# Patient Record
Sex: Female | Born: 1945 | Race: Asian | Hispanic: No | Marital: Married
Health system: Southern US, Community
[De-identification: ages and names within clinical notes are randomized; demographics above are authoritative.]

---

## 2000-08-08 ENCOUNTER — Encounter: Admission: RE | Admit: 2000-08-08 | Discharge: 2000-08-08 | Payer: Self-pay | Admitting: Internal Medicine

## 2000-08-22 ENCOUNTER — Ambulatory Visit (HOSPITAL_COMMUNITY): Admission: RE | Admit: 2000-08-22 | Discharge: 2000-08-22 | Payer: Self-pay | Admitting: *Deleted

## 2001-06-05 ENCOUNTER — Ambulatory Visit (HOSPITAL_COMMUNITY): Admission: RE | Admit: 2001-06-05 | Discharge: 2001-06-05 | Payer: Self-pay | Admitting: *Deleted

## 2002-04-23 ENCOUNTER — Encounter: Payer: Self-pay | Admitting: Internal Medicine

## 2002-04-23 ENCOUNTER — Encounter: Admission: RE | Admit: 2002-04-23 | Discharge: 2002-04-23 | Payer: Self-pay | Admitting: Internal Medicine

## 2004-05-02 ENCOUNTER — Encounter: Admission: RE | Admit: 2004-05-02 | Discharge: 2004-05-02 | Payer: Self-pay | Admitting: Internal Medicine

## 2005-04-24 ENCOUNTER — Other Ambulatory Visit: Admission: RE | Admit: 2005-04-24 | Discharge: 2005-04-24 | Payer: Self-pay | Admitting: Internal Medicine

## 2005-05-09 ENCOUNTER — Encounter: Admission: RE | Admit: 2005-05-09 | Discharge: 2005-05-09 | Payer: Self-pay | Admitting: Internal Medicine

## 2010-08-20 ENCOUNTER — Other Ambulatory Visit: Admission: RE | Admit: 2010-08-20 | Discharge: 2010-08-20 | Payer: Self-pay | Admitting: Internal Medicine

## 2010-09-12 ENCOUNTER — Encounter: Admission: RE | Admit: 2010-09-12 | Discharge: 2010-09-12 | Payer: Self-pay | Admitting: Internal Medicine

## 2010-12-09 ENCOUNTER — Encounter: Payer: Self-pay | Admitting: Internal Medicine

## 2012-02-27 ENCOUNTER — Other Ambulatory Visit: Payer: Self-pay | Admitting: Internal Medicine

## 2012-02-27 DIAGNOSIS — Z1231 Encounter for screening mammogram for malignant neoplasm of breast: Secondary | ICD-10-CM

## 2012-03-23 ENCOUNTER — Ambulatory Visit
Admission: RE | Admit: 2012-03-23 | Discharge: 2012-03-23 | Disposition: A | Discharge status: Home / Self Care | Payer: Medicare Other | Source: Ambulatory Visit | Attending: Internal Medicine | Admitting: Internal Medicine

## 2012-03-23 DIAGNOSIS — Z1231 Encounter for screening mammogram for malignant neoplasm of breast: Secondary | ICD-10-CM

## 2014-03-09 ENCOUNTER — Other Ambulatory Visit: Payer: Self-pay | Admitting: Internal Medicine

## 2014-03-09 DIAGNOSIS — Z1231 Encounter for screening mammogram for malignant neoplasm of breast: Secondary | ICD-10-CM

## 2014-03-25 ENCOUNTER — Encounter (INDEPENDENT_AMBULATORY_CARE_PROVIDER_SITE_OTHER): Payer: Self-pay

## 2014-03-25 ENCOUNTER — Ambulatory Visit
Admission: RE | Admit: 2014-03-25 | Discharge: 2014-03-25 | Disposition: A | Discharge status: Home / Self Care | Payer: Commercial Managed Care - HMO | Source: Ambulatory Visit | Attending: Internal Medicine | Admitting: Internal Medicine

## 2014-03-25 DIAGNOSIS — Z1231 Encounter for screening mammogram for malignant neoplasm of breast: Secondary | ICD-10-CM

## 2014-07-12 ENCOUNTER — Other Ambulatory Visit: Payer: Self-pay | Admitting: Internal Medicine

## 2014-07-12 DIAGNOSIS — R1013 Epigastric pain: Secondary | ICD-10-CM

## 2014-07-15 ENCOUNTER — Other Ambulatory Visit: Payer: Self-pay | Admitting: Internal Medicine

## 2014-07-15 ENCOUNTER — Ambulatory Visit
Admission: RE | Admit: 2014-07-15 | Discharge: 2014-07-15 | Disposition: A | Discharge status: Home / Self Care | Payer: Commercial Managed Care - HMO | Source: Ambulatory Visit | Attending: Internal Medicine | Admitting: Internal Medicine

## 2014-07-15 DIAGNOSIS — R1013 Epigastric pain: Secondary | ICD-10-CM

## 2015-03-24 ENCOUNTER — Other Ambulatory Visit: Payer: Self-pay | Admitting: Internal Medicine

## 2015-03-24 DIAGNOSIS — Z1231 Encounter for screening mammogram for malignant neoplasm of breast: Secondary | ICD-10-CM

## 2015-04-12 ENCOUNTER — Ambulatory Visit
Admission: RE | Admit: 2015-04-12 | Discharge: 2015-04-12 | Disposition: A | Discharge status: Home / Self Care | Payer: PPO | Source: Ambulatory Visit | Attending: Internal Medicine | Admitting: Internal Medicine

## 2015-04-12 DIAGNOSIS — Z1231 Encounter for screening mammogram for malignant neoplasm of breast: Secondary | ICD-10-CM

## 2016-03-27 ENCOUNTER — Other Ambulatory Visit: Payer: Self-pay | Admitting: Internal Medicine

## 2016-03-27 DIAGNOSIS — E7800 Pure hypercholesterolemia, unspecified: Secondary | ICD-10-CM | POA: Diagnosis not present

## 2016-03-27 DIAGNOSIS — E039 Hypothyroidism, unspecified: Secondary | ICD-10-CM | POA: Diagnosis not present

## 2016-03-27 DIAGNOSIS — I10 Essential (primary) hypertension: Secondary | ICD-10-CM | POA: Diagnosis not present

## 2016-03-27 DIAGNOSIS — Z1231 Encounter for screening mammogram for malignant neoplasm of breast: Secondary | ICD-10-CM

## 2016-03-27 DIAGNOSIS — K219 Gastro-esophageal reflux disease without esophagitis: Secondary | ICD-10-CM | POA: Diagnosis not present

## 2016-03-27 DIAGNOSIS — Z0000 Encounter for general adult medical examination without abnormal findings: Secondary | ICD-10-CM | POA: Diagnosis not present

## 2016-03-27 DIAGNOSIS — M1990 Unspecified osteoarthritis, unspecified site: Secondary | ICD-10-CM | POA: Diagnosis not present

## 2016-03-27 DIAGNOSIS — Z1389 Encounter for screening for other disorder: Secondary | ICD-10-CM | POA: Diagnosis not present

## 2016-04-22 ENCOUNTER — Ambulatory Visit
Admission: RE | Admit: 2016-04-22 | Discharge: 2016-04-22 | Disposition: A | Discharge status: Home / Self Care | Payer: PPO | Source: Ambulatory Visit | Attending: Internal Medicine | Admitting: Internal Medicine

## 2016-04-22 DIAGNOSIS — Z1231 Encounter for screening mammogram for malignant neoplasm of breast: Secondary | ICD-10-CM

## 2016-05-09 DIAGNOSIS — E039 Hypothyroidism, unspecified: Secondary | ICD-10-CM | POA: Diagnosis not present

## 2016-10-09 DIAGNOSIS — I10 Essential (primary) hypertension: Secondary | ICD-10-CM | POA: Diagnosis not present

## 2016-10-09 DIAGNOSIS — K219 Gastro-esophageal reflux disease without esophagitis: Secondary | ICD-10-CM | POA: Diagnosis not present

## 2016-10-09 DIAGNOSIS — E039 Hypothyroidism, unspecified: Secondary | ICD-10-CM | POA: Diagnosis not present

## 2016-10-09 DIAGNOSIS — R21 Rash and other nonspecific skin eruption: Secondary | ICD-10-CM | POA: Diagnosis not present

## 2016-10-09 DIAGNOSIS — E7800 Pure hypercholesterolemia, unspecified: Secondary | ICD-10-CM | POA: Diagnosis not present

## 2016-10-09 DIAGNOSIS — Z23 Encounter for immunization: Secondary | ICD-10-CM | POA: Diagnosis not present

## 2016-10-09 DIAGNOSIS — L989 Disorder of the skin and subcutaneous tissue, unspecified: Secondary | ICD-10-CM | POA: Diagnosis not present

## 2017-06-18 ENCOUNTER — Other Ambulatory Visit: Payer: Self-pay | Admitting: Internal Medicine

## 2017-06-18 DIAGNOSIS — Z1231 Encounter for screening mammogram for malignant neoplasm of breast: Secondary | ICD-10-CM

## 2017-06-18 DIAGNOSIS — K219 Gastro-esophageal reflux disease without esophagitis: Secondary | ICD-10-CM | POA: Diagnosis not present

## 2017-06-18 DIAGNOSIS — Z780 Asymptomatic menopausal state: Secondary | ICD-10-CM | POA: Diagnosis not present

## 2017-06-18 DIAGNOSIS — E7800 Pure hypercholesterolemia, unspecified: Secondary | ICD-10-CM | POA: Diagnosis not present

## 2017-06-18 DIAGNOSIS — K224 Dyskinesia of esophagus: Secondary | ICD-10-CM | POA: Diagnosis not present

## 2017-06-18 DIAGNOSIS — Z1239 Encounter for other screening for malignant neoplasm of breast: Secondary | ICD-10-CM | POA: Diagnosis not present

## 2017-06-18 DIAGNOSIS — I10 Essential (primary) hypertension: Secondary | ICD-10-CM | POA: Diagnosis not present

## 2017-06-18 DIAGNOSIS — E039 Hypothyroidism, unspecified: Secondary | ICD-10-CM | POA: Diagnosis not present

## 2017-06-18 DIAGNOSIS — Z7189 Other specified counseling: Secondary | ICD-10-CM | POA: Diagnosis not present

## 2017-06-18 DIAGNOSIS — Z0000 Encounter for general adult medical examination without abnormal findings: Secondary | ICD-10-CM | POA: Diagnosis not present

## 2017-06-18 DIAGNOSIS — Z1389 Encounter for screening for other disorder: Secondary | ICD-10-CM | POA: Diagnosis not present

## 2017-07-08 ENCOUNTER — Ambulatory Visit
Admission: RE | Admit: 2017-07-08 | Discharge: 2017-07-08 | Disposition: A | Discharge status: Home / Self Care | Payer: PPO | Source: Ambulatory Visit | Attending: Internal Medicine | Admitting: Internal Medicine

## 2017-07-08 DIAGNOSIS — Z1231 Encounter for screening mammogram for malignant neoplasm of breast: Secondary | ICD-10-CM | POA: Diagnosis not present

## 2017-08-19 DIAGNOSIS — Z780 Asymptomatic menopausal state: Secondary | ICD-10-CM | POA: Diagnosis not present

## 2017-09-23 DIAGNOSIS — E039 Hypothyroidism, unspecified: Secondary | ICD-10-CM | POA: Diagnosis not present

## 2017-09-23 DIAGNOSIS — L659 Nonscarring hair loss, unspecified: Secondary | ICD-10-CM | POA: Diagnosis not present

## 2017-09-23 DIAGNOSIS — E7800 Pure hypercholesterolemia, unspecified: Secondary | ICD-10-CM | POA: Diagnosis not present

## 2017-09-23 DIAGNOSIS — I10 Essential (primary) hypertension: Secondary | ICD-10-CM | POA: Diagnosis not present

## 2017-09-23 DIAGNOSIS — K219 Gastro-esophageal reflux disease without esophagitis: Secondary | ICD-10-CM | POA: Diagnosis not present

## 2017-11-12 DIAGNOSIS — R5383 Other fatigue: Secondary | ICD-10-CM | POA: Diagnosis not present

## 2017-11-12 DIAGNOSIS — R634 Abnormal weight loss: Secondary | ICD-10-CM | POA: Diagnosis not present

## 2017-11-26 DIAGNOSIS — R700 Elevated erythrocyte sedimentation rate: Secondary | ICD-10-CM | POA: Diagnosis not present

## 2017-11-26 DIAGNOSIS — R7989 Other specified abnormal findings of blood chemistry: Secondary | ICD-10-CM | POA: Diagnosis not present

## 2017-12-02 DIAGNOSIS — L648 Other androgenic alopecia: Secondary | ICD-10-CM | POA: Diagnosis not present

## 2017-12-22 DIAGNOSIS — E039 Hypothyroidism, unspecified: Secondary | ICD-10-CM | POA: Diagnosis not present

## 2017-12-22 DIAGNOSIS — R700 Elevated erythrocyte sedimentation rate: Secondary | ICD-10-CM | POA: Diagnosis not present

## 2017-12-22 DIAGNOSIS — I10 Essential (primary) hypertension: Secondary | ICD-10-CM | POA: Diagnosis not present

## 2017-12-22 DIAGNOSIS — R7309 Other abnormal glucose: Secondary | ICD-10-CM | POA: Diagnosis not present

## 2017-12-22 DIAGNOSIS — K219 Gastro-esophageal reflux disease without esophagitis: Secondary | ICD-10-CM | POA: Diagnosis not present

## 2017-12-22 DIAGNOSIS — E7800 Pure hypercholesterolemia, unspecified: Secondary | ICD-10-CM | POA: Diagnosis not present

## 2017-12-22 DIAGNOSIS — Z1389 Encounter for screening for other disorder: Secondary | ICD-10-CM | POA: Diagnosis not present

## 2018-06-24 DIAGNOSIS — E7800 Pure hypercholesterolemia, unspecified: Secondary | ICD-10-CM | POA: Diagnosis not present

## 2018-06-24 DIAGNOSIS — E039 Hypothyroidism, unspecified: Secondary | ICD-10-CM | POA: Diagnosis not present

## 2018-06-24 DIAGNOSIS — K219 Gastro-esophageal reflux disease without esophagitis: Secondary | ICD-10-CM | POA: Diagnosis not present

## 2018-06-24 DIAGNOSIS — R7303 Prediabetes: Secondary | ICD-10-CM | POA: Diagnosis not present

## 2018-06-24 DIAGNOSIS — M1990 Unspecified osteoarthritis, unspecified site: Secondary | ICD-10-CM | POA: Diagnosis not present

## 2018-06-24 DIAGNOSIS — Z1389 Encounter for screening for other disorder: Secondary | ICD-10-CM | POA: Diagnosis not present

## 2018-06-24 DIAGNOSIS — I10 Essential (primary) hypertension: Secondary | ICD-10-CM | POA: Diagnosis not present

## 2018-06-24 DIAGNOSIS — Z0000 Encounter for general adult medical examination without abnormal findings: Secondary | ICD-10-CM | POA: Diagnosis not present

## 2018-09-15 DIAGNOSIS — M1990 Unspecified osteoarthritis, unspecified site: Secondary | ICD-10-CM | POA: Diagnosis not present

## 2018-09-15 DIAGNOSIS — E7800 Pure hypercholesterolemia, unspecified: Secondary | ICD-10-CM | POA: Diagnosis not present

## 2018-09-15 DIAGNOSIS — E039 Hypothyroidism, unspecified: Secondary | ICD-10-CM | POA: Diagnosis not present

## 2018-09-15 DIAGNOSIS — I10 Essential (primary) hypertension: Secondary | ICD-10-CM | POA: Diagnosis not present

## 2018-12-29 DIAGNOSIS — E7800 Pure hypercholesterolemia, unspecified: Secondary | ICD-10-CM | POA: Diagnosis not present

## 2018-12-29 DIAGNOSIS — K219 Gastro-esophageal reflux disease without esophagitis: Secondary | ICD-10-CM | POA: Diagnosis not present

## 2018-12-29 DIAGNOSIS — I10 Essential (primary) hypertension: Secondary | ICD-10-CM | POA: Diagnosis not present

## 2018-12-29 DIAGNOSIS — E039 Hypothyroidism, unspecified: Secondary | ICD-10-CM | POA: Diagnosis not present

## 2018-12-29 DIAGNOSIS — L299 Pruritus, unspecified: Secondary | ICD-10-CM | POA: Diagnosis not present

## 2019-01-04 DIAGNOSIS — R208 Other disturbances of skin sensation: Secondary | ICD-10-CM | POA: Diagnosis not present

## 2019-01-04 DIAGNOSIS — L218 Other seborrheic dermatitis: Secondary | ICD-10-CM | POA: Diagnosis not present

## 2019-01-04 DIAGNOSIS — B86 Scabies: Secondary | ICD-10-CM | POA: Diagnosis not present

## 2019-05-17 DIAGNOSIS — M1990 Unspecified osteoarthritis, unspecified site: Secondary | ICD-10-CM | POA: Diagnosis not present

## 2019-05-17 DIAGNOSIS — E039 Hypothyroidism, unspecified: Secondary | ICD-10-CM | POA: Diagnosis not present

## 2019-05-17 DIAGNOSIS — E7800 Pure hypercholesterolemia, unspecified: Secondary | ICD-10-CM | POA: Diagnosis not present

## 2019-05-17 DIAGNOSIS — I10 Essential (primary) hypertension: Secondary | ICD-10-CM | POA: Diagnosis not present

## 2019-05-27 DIAGNOSIS — E039 Hypothyroidism, unspecified: Secondary | ICD-10-CM | POA: Diagnosis not present

## 2019-05-27 DIAGNOSIS — E7800 Pure hypercholesterolemia, unspecified: Secondary | ICD-10-CM | POA: Diagnosis not present

## 2019-05-27 DIAGNOSIS — M1990 Unspecified osteoarthritis, unspecified site: Secondary | ICD-10-CM | POA: Diagnosis not present

## 2019-05-27 DIAGNOSIS — I10 Essential (primary) hypertension: Secondary | ICD-10-CM | POA: Diagnosis not present

## 2019-07-06 ENCOUNTER — Other Ambulatory Visit: Payer: Self-pay | Admitting: Internal Medicine

## 2019-07-06 DIAGNOSIS — R7309 Other abnormal glucose: Secondary | ICD-10-CM | POA: Diagnosis not present

## 2019-07-06 DIAGNOSIS — Z1231 Encounter for screening mammogram for malignant neoplasm of breast: Secondary | ICD-10-CM

## 2019-07-06 DIAGNOSIS — Z1239 Encounter for other screening for malignant neoplasm of breast: Secondary | ICD-10-CM | POA: Diagnosis not present

## 2019-07-06 DIAGNOSIS — I10 Essential (primary) hypertension: Secondary | ICD-10-CM | POA: Diagnosis not present

## 2019-07-06 DIAGNOSIS — K219 Gastro-esophageal reflux disease without esophagitis: Secondary | ICD-10-CM | POA: Diagnosis not present

## 2019-07-06 DIAGNOSIS — J309 Allergic rhinitis, unspecified: Secondary | ICD-10-CM | POA: Diagnosis not present

## 2019-07-06 DIAGNOSIS — E039 Hypothyroidism, unspecified: Secondary | ICD-10-CM | POA: Diagnosis not present

## 2019-07-06 DIAGNOSIS — Z1389 Encounter for screening for other disorder: Secondary | ICD-10-CM | POA: Diagnosis not present

## 2019-07-06 DIAGNOSIS — Z0000 Encounter for general adult medical examination without abnormal findings: Secondary | ICD-10-CM | POA: Diagnosis not present

## 2019-07-06 DIAGNOSIS — R7303 Prediabetes: Secondary | ICD-10-CM | POA: Diagnosis not present

## 2019-07-06 DIAGNOSIS — H8110 Benign paroxysmal vertigo, unspecified ear: Secondary | ICD-10-CM | POA: Diagnosis not present

## 2019-07-06 DIAGNOSIS — E7800 Pure hypercholesterolemia, unspecified: Secondary | ICD-10-CM | POA: Diagnosis not present

## 2019-07-19 DIAGNOSIS — E039 Hypothyroidism, unspecified: Secondary | ICD-10-CM | POA: Diagnosis not present

## 2019-07-19 DIAGNOSIS — M1990 Unspecified osteoarthritis, unspecified site: Secondary | ICD-10-CM | POA: Diagnosis not present

## 2019-07-19 DIAGNOSIS — E7800 Pure hypercholesterolemia, unspecified: Secondary | ICD-10-CM | POA: Diagnosis not present

## 2019-07-19 DIAGNOSIS — I10 Essential (primary) hypertension: Secondary | ICD-10-CM | POA: Diagnosis not present

## 2019-08-06 DIAGNOSIS — M1990 Unspecified osteoarthritis, unspecified site: Secondary | ICD-10-CM | POA: Diagnosis not present

## 2019-08-06 DIAGNOSIS — I10 Essential (primary) hypertension: Secondary | ICD-10-CM | POA: Diagnosis not present

## 2019-08-06 DIAGNOSIS — E039 Hypothyroidism, unspecified: Secondary | ICD-10-CM | POA: Diagnosis not present

## 2019-08-06 DIAGNOSIS — E7800 Pure hypercholesterolemia, unspecified: Secondary | ICD-10-CM | POA: Diagnosis not present

## 2019-08-20 ENCOUNTER — Other Ambulatory Visit: Payer: Self-pay

## 2019-08-20 ENCOUNTER — Ambulatory Visit
Admission: RE | Admit: 2019-08-20 | Discharge: 2019-08-20 | Disposition: A | Discharge status: Home / Self Care | Payer: PPO | Source: Ambulatory Visit | Attending: Internal Medicine | Admitting: Internal Medicine

## 2019-08-20 DIAGNOSIS — Z1231 Encounter for screening mammogram for malignant neoplasm of breast: Secondary | ICD-10-CM

## 2019-08-26 DIAGNOSIS — M1990 Unspecified osteoarthritis, unspecified site: Secondary | ICD-10-CM | POA: Diagnosis not present

## 2019-08-26 DIAGNOSIS — E039 Hypothyroidism, unspecified: Secondary | ICD-10-CM | POA: Diagnosis not present

## 2019-08-26 DIAGNOSIS — I10 Essential (primary) hypertension: Secondary | ICD-10-CM | POA: Diagnosis not present

## 2019-08-26 DIAGNOSIS — E7800 Pure hypercholesterolemia, unspecified: Secondary | ICD-10-CM | POA: Diagnosis not present

## 2019-10-04 DIAGNOSIS — E7800 Pure hypercholesterolemia, unspecified: Secondary | ICD-10-CM | POA: Diagnosis not present

## 2019-10-04 DIAGNOSIS — E039 Hypothyroidism, unspecified: Secondary | ICD-10-CM | POA: Diagnosis not present

## 2019-10-04 DIAGNOSIS — M1990 Unspecified osteoarthritis, unspecified site: Secondary | ICD-10-CM | POA: Diagnosis not present

## 2019-10-04 DIAGNOSIS — I10 Essential (primary) hypertension: Secondary | ICD-10-CM | POA: Diagnosis not present

## 2019-10-20 DIAGNOSIS — M1990 Unspecified osteoarthritis, unspecified site: Secondary | ICD-10-CM | POA: Diagnosis not present

## 2019-10-20 DIAGNOSIS — I10 Essential (primary) hypertension: Secondary | ICD-10-CM | POA: Diagnosis not present

## 2019-10-20 DIAGNOSIS — E039 Hypothyroidism, unspecified: Secondary | ICD-10-CM | POA: Diagnosis not present

## 2019-10-20 DIAGNOSIS — E7800 Pure hypercholesterolemia, unspecified: Secondary | ICD-10-CM | POA: Diagnosis not present

## 2019-11-24 DIAGNOSIS — I10 Essential (primary) hypertension: Secondary | ICD-10-CM | POA: Diagnosis not present

## 2019-11-24 DIAGNOSIS — E039 Hypothyroidism, unspecified: Secondary | ICD-10-CM | POA: Diagnosis not present

## 2019-11-24 DIAGNOSIS — M1990 Unspecified osteoarthritis, unspecified site: Secondary | ICD-10-CM | POA: Diagnosis not present

## 2019-11-24 DIAGNOSIS — E7800 Pure hypercholesterolemia, unspecified: Secondary | ICD-10-CM | POA: Diagnosis not present

## 2019-12-24 DIAGNOSIS — E1169 Type 2 diabetes mellitus with other specified complication: Secondary | ICD-10-CM | POA: Diagnosis not present

## 2019-12-24 DIAGNOSIS — M1990 Unspecified osteoarthritis, unspecified site: Secondary | ICD-10-CM | POA: Diagnosis not present

## 2019-12-24 DIAGNOSIS — I10 Essential (primary) hypertension: Secondary | ICD-10-CM | POA: Diagnosis not present

## 2019-12-24 DIAGNOSIS — E039 Hypothyroidism, unspecified: Secondary | ICD-10-CM | POA: Diagnosis not present

## 2019-12-24 DIAGNOSIS — E7800 Pure hypercholesterolemia, unspecified: Secondary | ICD-10-CM | POA: Diagnosis not present

## 2020-01-12 DIAGNOSIS — E1169 Type 2 diabetes mellitus with other specified complication: Secondary | ICD-10-CM | POA: Diagnosis not present

## 2020-01-12 DIAGNOSIS — I10 Essential (primary) hypertension: Secondary | ICD-10-CM | POA: Diagnosis not present

## 2020-01-12 DIAGNOSIS — E7800 Pure hypercholesterolemia, unspecified: Secondary | ICD-10-CM | POA: Diagnosis not present

## 2020-01-12 DIAGNOSIS — E039 Hypothyroidism, unspecified: Secondary | ICD-10-CM | POA: Diagnosis not present

## 2020-01-12 DIAGNOSIS — E119 Type 2 diabetes mellitus without complications: Secondary | ICD-10-CM | POA: Diagnosis not present

## 2020-01-12 DIAGNOSIS — M1990 Unspecified osteoarthritis, unspecified site: Secondary | ICD-10-CM | POA: Diagnosis not present

## 2020-01-21 DIAGNOSIS — E039 Hypothyroidism, unspecified: Secondary | ICD-10-CM | POA: Diagnosis not present

## 2020-01-21 DIAGNOSIS — I10 Essential (primary) hypertension: Secondary | ICD-10-CM | POA: Diagnosis not present

## 2020-01-21 DIAGNOSIS — Z7984 Long term (current) use of oral hypoglycemic drugs: Secondary | ICD-10-CM | POA: Diagnosis not present

## 2020-01-21 DIAGNOSIS — E1169 Type 2 diabetes mellitus with other specified complication: Secondary | ICD-10-CM | POA: Diagnosis not present

## 2020-01-21 DIAGNOSIS — E7800 Pure hypercholesterolemia, unspecified: Secondary | ICD-10-CM | POA: Diagnosis not present

## 2020-01-21 DIAGNOSIS — M1990 Unspecified osteoarthritis, unspecified site: Secondary | ICD-10-CM | POA: Diagnosis not present

## 2020-02-22 DIAGNOSIS — E7800 Pure hypercholesterolemia, unspecified: Secondary | ICD-10-CM | POA: Diagnosis not present

## 2020-02-22 DIAGNOSIS — M1990 Unspecified osteoarthritis, unspecified site: Secondary | ICD-10-CM | POA: Diagnosis not present

## 2020-02-22 DIAGNOSIS — E1169 Type 2 diabetes mellitus with other specified complication: Secondary | ICD-10-CM | POA: Diagnosis not present

## 2020-02-22 DIAGNOSIS — I10 Essential (primary) hypertension: Secondary | ICD-10-CM | POA: Diagnosis not present

## 2020-02-22 DIAGNOSIS — E039 Hypothyroidism, unspecified: Secondary | ICD-10-CM | POA: Diagnosis not present

## 2020-03-20 DIAGNOSIS — E7800 Pure hypercholesterolemia, unspecified: Secondary | ICD-10-CM | POA: Diagnosis not present

## 2020-03-20 DIAGNOSIS — I10 Essential (primary) hypertension: Secondary | ICD-10-CM | POA: Diagnosis not present

## 2020-03-20 DIAGNOSIS — M1990 Unspecified osteoarthritis, unspecified site: Secondary | ICD-10-CM | POA: Diagnosis not present

## 2020-03-20 DIAGNOSIS — E039 Hypothyroidism, unspecified: Secondary | ICD-10-CM | POA: Diagnosis not present

## 2020-03-20 DIAGNOSIS — E1169 Type 2 diabetes mellitus with other specified complication: Secondary | ICD-10-CM | POA: Diagnosis not present

## 2020-04-24 DIAGNOSIS — E1169 Type 2 diabetes mellitus with other specified complication: Secondary | ICD-10-CM | POA: Diagnosis not present

## 2020-04-24 DIAGNOSIS — M1990 Unspecified osteoarthritis, unspecified site: Secondary | ICD-10-CM | POA: Diagnosis not present

## 2020-04-24 DIAGNOSIS — E7800 Pure hypercholesterolemia, unspecified: Secondary | ICD-10-CM | POA: Diagnosis not present

## 2020-04-24 DIAGNOSIS — E039 Hypothyroidism, unspecified: Secondary | ICD-10-CM | POA: Diagnosis not present

## 2020-04-24 DIAGNOSIS — I10 Essential (primary) hypertension: Secondary | ICD-10-CM | POA: Diagnosis not present

## 2020-06-01 DIAGNOSIS — I10 Essential (primary) hypertension: Secondary | ICD-10-CM | POA: Diagnosis not present

## 2020-06-01 DIAGNOSIS — E7800 Pure hypercholesterolemia, unspecified: Secondary | ICD-10-CM | POA: Diagnosis not present

## 2020-06-01 DIAGNOSIS — M1990 Unspecified osteoarthritis, unspecified site: Secondary | ICD-10-CM | POA: Diagnosis not present

## 2020-06-01 DIAGNOSIS — E1169 Type 2 diabetes mellitus with other specified complication: Secondary | ICD-10-CM | POA: Diagnosis not present

## 2020-06-01 DIAGNOSIS — E039 Hypothyroidism, unspecified: Secondary | ICD-10-CM | POA: Diagnosis not present

## 2020-06-21 DIAGNOSIS — E039 Hypothyroidism, unspecified: Secondary | ICD-10-CM | POA: Diagnosis not present

## 2020-06-21 DIAGNOSIS — I10 Essential (primary) hypertension: Secondary | ICD-10-CM | POA: Diagnosis not present

## 2020-06-21 DIAGNOSIS — E7800 Pure hypercholesterolemia, unspecified: Secondary | ICD-10-CM | POA: Diagnosis not present

## 2020-06-21 DIAGNOSIS — E1169 Type 2 diabetes mellitus with other specified complication: Secondary | ICD-10-CM | POA: Diagnosis not present

## 2020-06-21 DIAGNOSIS — Z7984 Long term (current) use of oral hypoglycemic drugs: Secondary | ICD-10-CM | POA: Diagnosis not present

## 2020-06-21 DIAGNOSIS — M1990 Unspecified osteoarthritis, unspecified site: Secondary | ICD-10-CM | POA: Diagnosis not present

## 2020-07-11 DIAGNOSIS — Z1389 Encounter for screening for other disorder: Secondary | ICD-10-CM | POA: Diagnosis not present

## 2020-07-11 DIAGNOSIS — E1169 Type 2 diabetes mellitus with other specified complication: Secondary | ICD-10-CM | POA: Diagnosis not present

## 2020-07-11 DIAGNOSIS — E039 Hypothyroidism, unspecified: Secondary | ICD-10-CM | POA: Diagnosis not present

## 2020-07-11 DIAGNOSIS — M1990 Unspecified osteoarthritis, unspecified site: Secondary | ICD-10-CM | POA: Diagnosis not present

## 2020-07-11 DIAGNOSIS — Z0000 Encounter for general adult medical examination without abnormal findings: Secondary | ICD-10-CM | POA: Diagnosis not present

## 2020-07-11 DIAGNOSIS — K219 Gastro-esophageal reflux disease without esophagitis: Secondary | ICD-10-CM | POA: Diagnosis not present

## 2020-07-11 DIAGNOSIS — I10 Essential (primary) hypertension: Secondary | ICD-10-CM | POA: Diagnosis not present

## 2020-07-11 DIAGNOSIS — E7800 Pure hypercholesterolemia, unspecified: Secondary | ICD-10-CM | POA: Diagnosis not present

## 2020-07-21 DIAGNOSIS — E1169 Type 2 diabetes mellitus with other specified complication: Secondary | ICD-10-CM | POA: Diagnosis not present

## 2020-07-21 DIAGNOSIS — I10 Essential (primary) hypertension: Secondary | ICD-10-CM | POA: Diagnosis not present

## 2020-07-21 DIAGNOSIS — E7800 Pure hypercholesterolemia, unspecified: Secondary | ICD-10-CM | POA: Diagnosis not present

## 2020-07-21 DIAGNOSIS — E039 Hypothyroidism, unspecified: Secondary | ICD-10-CM | POA: Diagnosis not present

## 2020-07-21 DIAGNOSIS — M1990 Unspecified osteoarthritis, unspecified site: Secondary | ICD-10-CM | POA: Diagnosis not present

## 2020-09-06 DIAGNOSIS — I10 Essential (primary) hypertension: Secondary | ICD-10-CM | POA: Diagnosis not present

## 2020-09-06 DIAGNOSIS — E1169 Type 2 diabetes mellitus with other specified complication: Secondary | ICD-10-CM | POA: Diagnosis not present

## 2020-09-06 DIAGNOSIS — E7800 Pure hypercholesterolemia, unspecified: Secondary | ICD-10-CM | POA: Diagnosis not present

## 2020-09-06 DIAGNOSIS — E039 Hypothyroidism, unspecified: Secondary | ICD-10-CM | POA: Diagnosis not present

## 2020-09-06 DIAGNOSIS — M1990 Unspecified osteoarthritis, unspecified site: Secondary | ICD-10-CM | POA: Diagnosis not present

## 2020-09-18 DIAGNOSIS — E7800 Pure hypercholesterolemia, unspecified: Secondary | ICD-10-CM | POA: Diagnosis not present

## 2020-09-18 DIAGNOSIS — E039 Hypothyroidism, unspecified: Secondary | ICD-10-CM | POA: Diagnosis not present

## 2020-09-18 DIAGNOSIS — E1169 Type 2 diabetes mellitus with other specified complication: Secondary | ICD-10-CM | POA: Diagnosis not present

## 2020-09-18 DIAGNOSIS — K219 Gastro-esophageal reflux disease without esophagitis: Secondary | ICD-10-CM | POA: Diagnosis not present

## 2020-09-18 DIAGNOSIS — I10 Essential (primary) hypertension: Secondary | ICD-10-CM | POA: Diagnosis not present

## 2020-09-18 DIAGNOSIS — M1990 Unspecified osteoarthritis, unspecified site: Secondary | ICD-10-CM | POA: Diagnosis not present

## 2020-11-02 DIAGNOSIS — M1990 Unspecified osteoarthritis, unspecified site: Secondary | ICD-10-CM | POA: Diagnosis not present

## 2020-11-02 DIAGNOSIS — E7800 Pure hypercholesterolemia, unspecified: Secondary | ICD-10-CM | POA: Diagnosis not present

## 2020-11-02 DIAGNOSIS — E039 Hypothyroidism, unspecified: Secondary | ICD-10-CM | POA: Diagnosis not present

## 2020-11-02 DIAGNOSIS — K219 Gastro-esophageal reflux disease without esophagitis: Secondary | ICD-10-CM | POA: Diagnosis not present

## 2020-11-02 DIAGNOSIS — I10 Essential (primary) hypertension: Secondary | ICD-10-CM | POA: Diagnosis not present

## 2020-11-02 DIAGNOSIS — E1169 Type 2 diabetes mellitus with other specified complication: Secondary | ICD-10-CM | POA: Diagnosis not present

## 2020-11-30 DIAGNOSIS — K219 Gastro-esophageal reflux disease without esophagitis: Secondary | ICD-10-CM | POA: Diagnosis not present

## 2020-11-30 DIAGNOSIS — M1990 Unspecified osteoarthritis, unspecified site: Secondary | ICD-10-CM | POA: Diagnosis not present

## 2020-11-30 DIAGNOSIS — E039 Hypothyroidism, unspecified: Secondary | ICD-10-CM | POA: Diagnosis not present

## 2020-11-30 DIAGNOSIS — E7800 Pure hypercholesterolemia, unspecified: Secondary | ICD-10-CM | POA: Diagnosis not present

## 2020-11-30 DIAGNOSIS — E1169 Type 2 diabetes mellitus with other specified complication: Secondary | ICD-10-CM | POA: Diagnosis not present

## 2020-11-30 DIAGNOSIS — I10 Essential (primary) hypertension: Secondary | ICD-10-CM | POA: Diagnosis not present

## 2021-01-09 DIAGNOSIS — J309 Allergic rhinitis, unspecified: Secondary | ICD-10-CM | POA: Diagnosis not present

## 2021-01-09 DIAGNOSIS — E119 Type 2 diabetes mellitus without complications: Secondary | ICD-10-CM | POA: Diagnosis not present

## 2021-01-09 DIAGNOSIS — E1169 Type 2 diabetes mellitus with other specified complication: Secondary | ICD-10-CM | POA: Diagnosis not present

## 2021-01-09 DIAGNOSIS — K219 Gastro-esophageal reflux disease without esophagitis: Secondary | ICD-10-CM | POA: Diagnosis not present

## 2021-01-09 DIAGNOSIS — I10 Essential (primary) hypertension: Secondary | ICD-10-CM | POA: Diagnosis not present

## 2021-01-09 DIAGNOSIS — E039 Hypothyroidism, unspecified: Secondary | ICD-10-CM | POA: Diagnosis not present

## 2021-01-09 DIAGNOSIS — E7800 Pure hypercholesterolemia, unspecified: Secondary | ICD-10-CM | POA: Diagnosis not present

## 2021-01-09 DIAGNOSIS — Z7984 Long term (current) use of oral hypoglycemic drugs: Secondary | ICD-10-CM | POA: Diagnosis not present

## 2021-01-11 DIAGNOSIS — E1169 Type 2 diabetes mellitus with other specified complication: Secondary | ICD-10-CM | POA: Diagnosis not present

## 2021-01-11 DIAGNOSIS — M1990 Unspecified osteoarthritis, unspecified site: Secondary | ICD-10-CM | POA: Diagnosis not present

## 2021-01-11 DIAGNOSIS — K219 Gastro-esophageal reflux disease without esophagitis: Secondary | ICD-10-CM | POA: Diagnosis not present

## 2021-01-11 DIAGNOSIS — E7800 Pure hypercholesterolemia, unspecified: Secondary | ICD-10-CM | POA: Diagnosis not present

## 2021-01-11 DIAGNOSIS — I10 Essential (primary) hypertension: Secondary | ICD-10-CM | POA: Diagnosis not present

## 2021-01-11 DIAGNOSIS — E039 Hypothyroidism, unspecified: Secondary | ICD-10-CM | POA: Diagnosis not present

## 2021-01-11 IMAGING — MG MM DIGITAL SCREENING BILAT W/ TOMO W/ CAD
8 series · 8 of 24 positions shown · non-contrast
Comparison: Previous exam(s).

CLINICAL DATA: Screening.

EXAM:
DIGITAL SCREENING BILATERAL MAMMOGRAM WITH TOMO AND CAD

[R CC synth-2D]
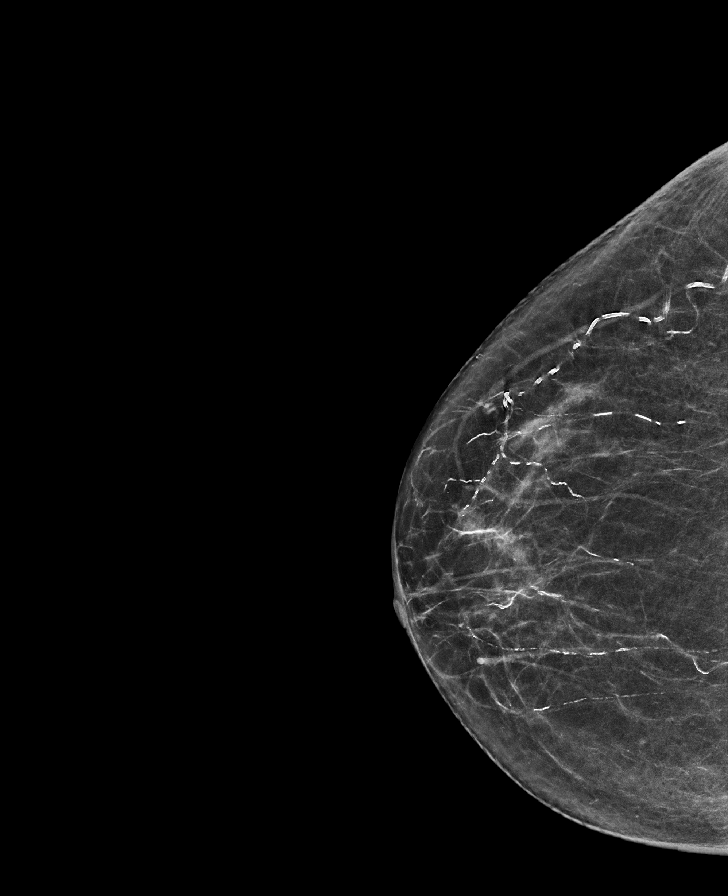

[L MLO synth-2D]
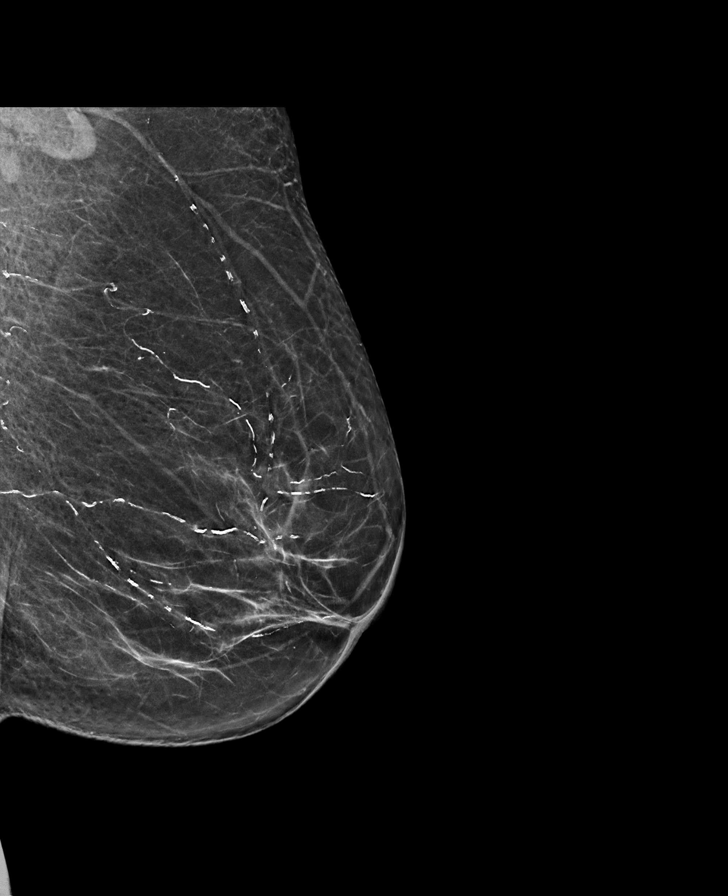

[R MLO synth-2D]
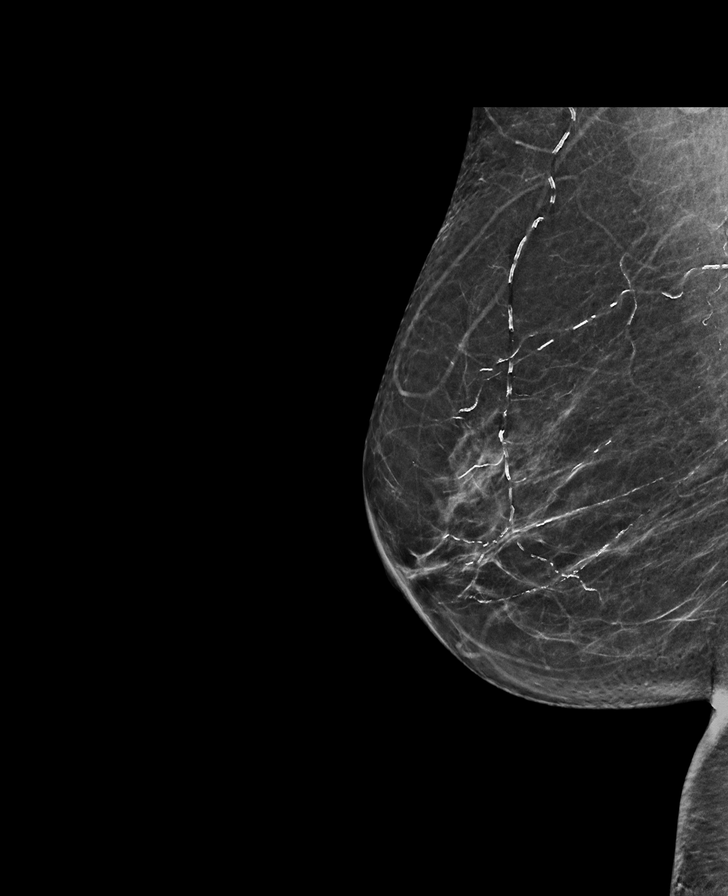

[L CC synth-2D]
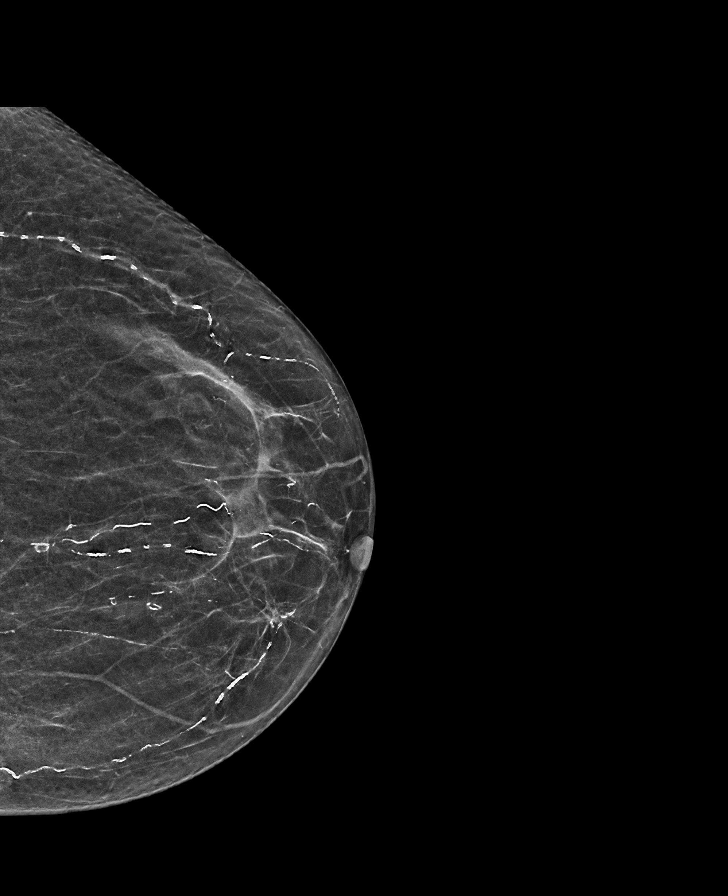

[R CC tomo · tomo slice 32/63.0]
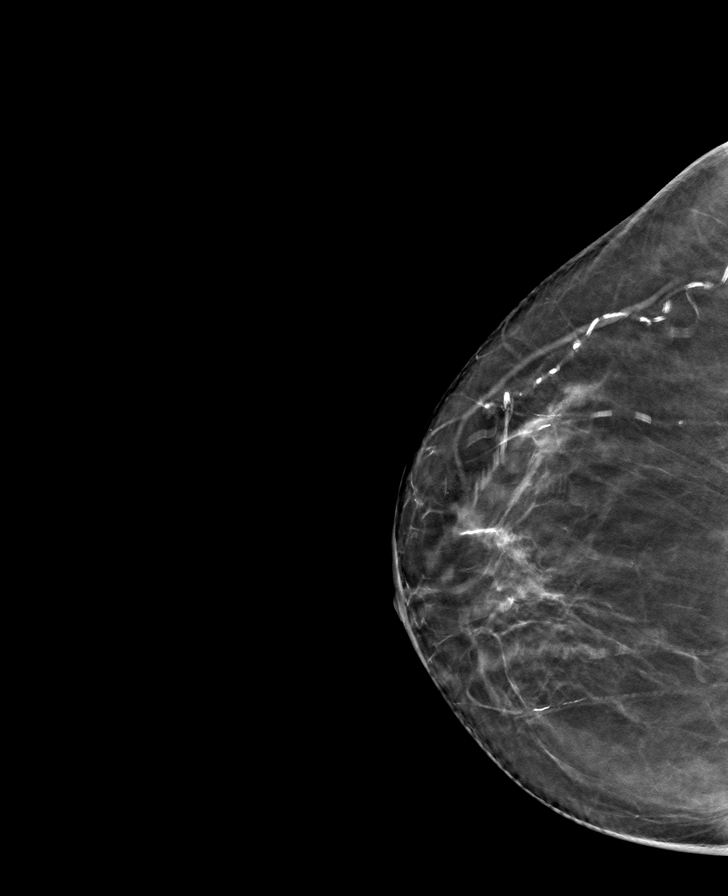

[R MLO tomo · tomo slice 31/61.0]
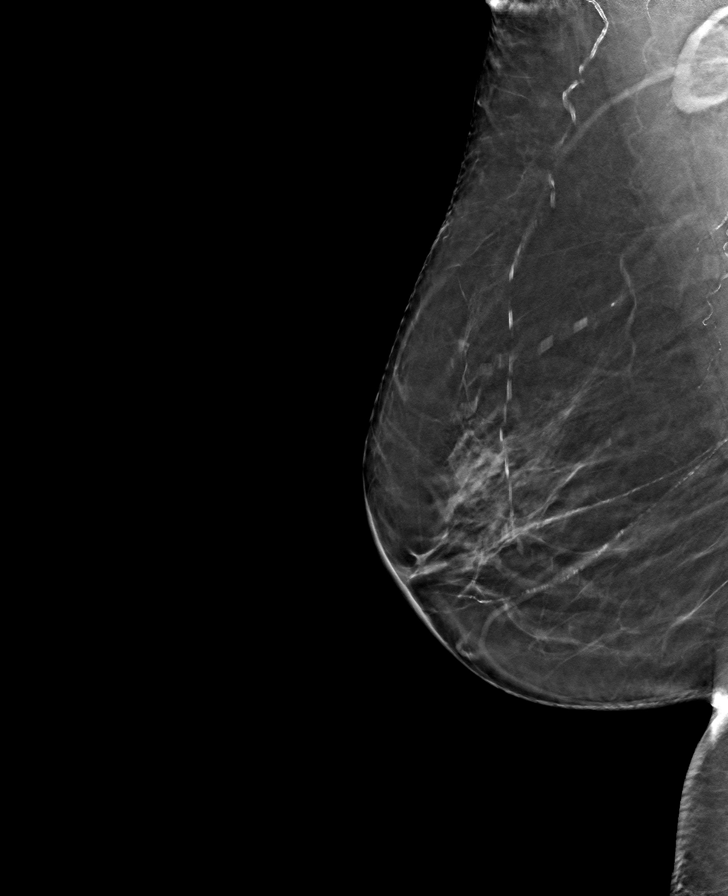

[L CC tomo · tomo slice 27/54.0]
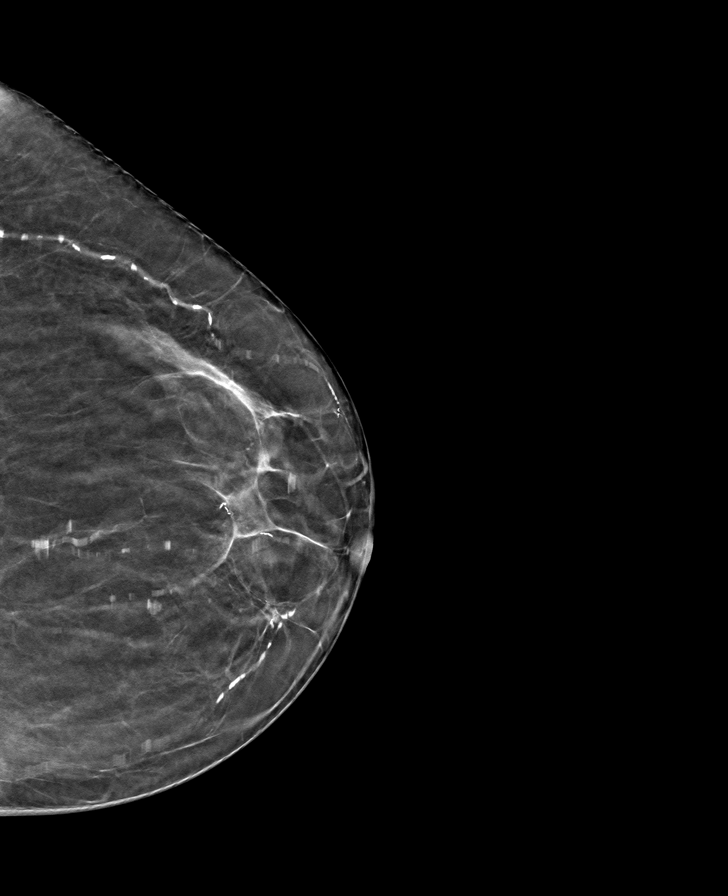

[L MLO tomo · tomo slice 31/62.0]
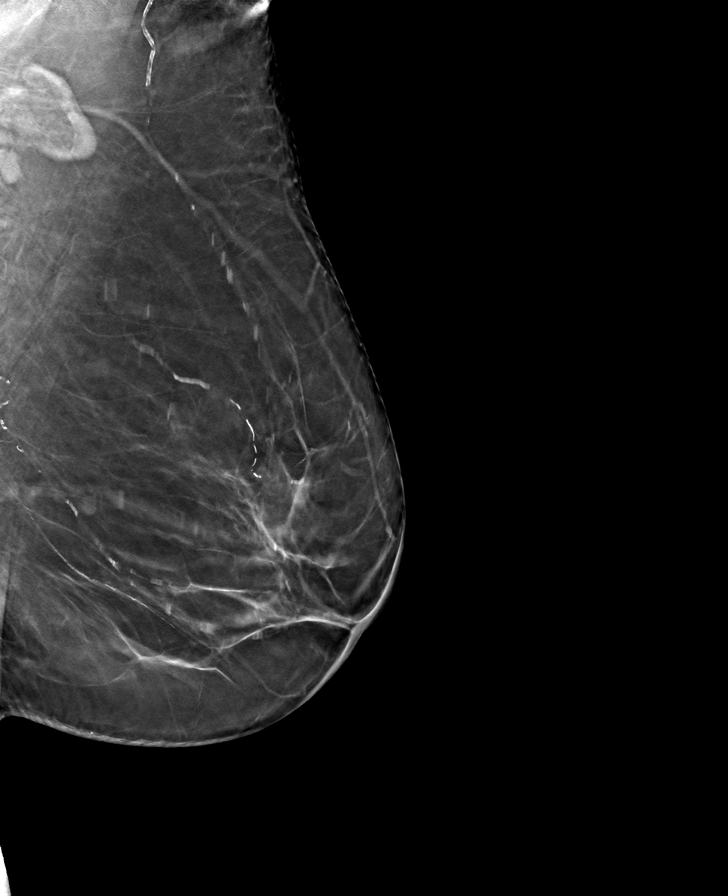

[8 of 24 positions shown; findings below may reference images not displayed]

ACR Breast Density Category b: There are scattered areas of
fibroglandular density.
FINDINGS: There are no findings suspicious for malignancy. Images were
processed with CAD.
IMPRESSION: No mammographic evidence of malignancy. A result letter of this
screening mammogram will be mailed directly to the patient.

RECOMMENDATION:
Screening mammogram in one year. (Code:[id number])

BI-RADS CATEGORY  1: Negative.

## 2021-01-19 DIAGNOSIS — K219 Gastro-esophageal reflux disease without esophagitis: Secondary | ICD-10-CM | POA: Diagnosis not present

## 2021-01-19 DIAGNOSIS — E039 Hypothyroidism, unspecified: Secondary | ICD-10-CM | POA: Diagnosis not present

## 2021-01-19 DIAGNOSIS — E1169 Type 2 diabetes mellitus with other specified complication: Secondary | ICD-10-CM | POA: Diagnosis not present

## 2021-01-19 DIAGNOSIS — E7800 Pure hypercholesterolemia, unspecified: Secondary | ICD-10-CM | POA: Diagnosis not present

## 2021-01-19 DIAGNOSIS — M1990 Unspecified osteoarthritis, unspecified site: Secondary | ICD-10-CM | POA: Diagnosis not present

## 2021-01-19 DIAGNOSIS — I10 Essential (primary) hypertension: Secondary | ICD-10-CM | POA: Diagnosis not present

## 2021-02-19 DIAGNOSIS — I10 Essential (primary) hypertension: Secondary | ICD-10-CM | POA: Diagnosis not present

## 2021-02-19 DIAGNOSIS — M1990 Unspecified osteoarthritis, unspecified site: Secondary | ICD-10-CM | POA: Diagnosis not present

## 2021-02-19 DIAGNOSIS — E039 Hypothyroidism, unspecified: Secondary | ICD-10-CM | POA: Diagnosis not present

## 2021-02-19 DIAGNOSIS — E1169 Type 2 diabetes mellitus with other specified complication: Secondary | ICD-10-CM | POA: Diagnosis not present

## 2021-02-19 DIAGNOSIS — K219 Gastro-esophageal reflux disease without esophagitis: Secondary | ICD-10-CM | POA: Diagnosis not present

## 2021-02-19 DIAGNOSIS — E7800 Pure hypercholesterolemia, unspecified: Secondary | ICD-10-CM | POA: Diagnosis not present

## 2021-03-20 DIAGNOSIS — I10 Essential (primary) hypertension: Secondary | ICD-10-CM | POA: Diagnosis not present

## 2021-03-20 DIAGNOSIS — K219 Gastro-esophageal reflux disease without esophagitis: Secondary | ICD-10-CM | POA: Diagnosis not present

## 2021-03-20 DIAGNOSIS — E1169 Type 2 diabetes mellitus with other specified complication: Secondary | ICD-10-CM | POA: Diagnosis not present

## 2021-03-20 DIAGNOSIS — E039 Hypothyroidism, unspecified: Secondary | ICD-10-CM | POA: Diagnosis not present

## 2021-03-20 DIAGNOSIS — E7800 Pure hypercholesterolemia, unspecified: Secondary | ICD-10-CM | POA: Diagnosis not present

## 2021-03-20 DIAGNOSIS — M1990 Unspecified osteoarthritis, unspecified site: Secondary | ICD-10-CM | POA: Diagnosis not present

## 2021-05-17 DIAGNOSIS — E1169 Type 2 diabetes mellitus with other specified complication: Secondary | ICD-10-CM | POA: Diagnosis not present

## 2021-05-17 DIAGNOSIS — M1990 Unspecified osteoarthritis, unspecified site: Secondary | ICD-10-CM | POA: Diagnosis not present

## 2021-05-17 DIAGNOSIS — K219 Gastro-esophageal reflux disease without esophagitis: Secondary | ICD-10-CM | POA: Diagnosis not present

## 2021-05-17 DIAGNOSIS — E7800 Pure hypercholesterolemia, unspecified: Secondary | ICD-10-CM | POA: Diagnosis not present

## 2021-05-17 DIAGNOSIS — E039 Hypothyroidism, unspecified: Secondary | ICD-10-CM | POA: Diagnosis not present

## 2021-05-17 DIAGNOSIS — I10 Essential (primary) hypertension: Secondary | ICD-10-CM | POA: Diagnosis not present

## 2021-06-15 DIAGNOSIS — M1990 Unspecified osteoarthritis, unspecified site: Secondary | ICD-10-CM | POA: Diagnosis not present

## 2021-06-15 DIAGNOSIS — E1169 Type 2 diabetes mellitus with other specified complication: Secondary | ICD-10-CM | POA: Diagnosis not present

## 2021-06-15 DIAGNOSIS — E039 Hypothyroidism, unspecified: Secondary | ICD-10-CM | POA: Diagnosis not present

## 2021-06-15 DIAGNOSIS — K219 Gastro-esophageal reflux disease without esophagitis: Secondary | ICD-10-CM | POA: Diagnosis not present

## 2021-06-15 DIAGNOSIS — I10 Essential (primary) hypertension: Secondary | ICD-10-CM | POA: Diagnosis not present

## 2021-06-15 DIAGNOSIS — E7800 Pure hypercholesterolemia, unspecified: Secondary | ICD-10-CM | POA: Diagnosis not present

## 2021-07-17 DIAGNOSIS — J309 Allergic rhinitis, unspecified: Secondary | ICD-10-CM | POA: Diagnosis not present

## 2021-07-17 DIAGNOSIS — Z1211 Encounter for screening for malignant neoplasm of colon: Secondary | ICD-10-CM | POA: Diagnosis not present

## 2021-07-17 DIAGNOSIS — E1169 Type 2 diabetes mellitus with other specified complication: Secondary | ICD-10-CM | POA: Diagnosis not present

## 2021-07-17 DIAGNOSIS — I10 Essential (primary) hypertension: Secondary | ICD-10-CM | POA: Diagnosis not present

## 2021-07-17 DIAGNOSIS — E7800 Pure hypercholesterolemia, unspecified: Secondary | ICD-10-CM | POA: Diagnosis not present

## 2021-07-17 DIAGNOSIS — K219 Gastro-esophageal reflux disease without esophagitis: Secondary | ICD-10-CM | POA: Diagnosis not present

## 2021-07-17 DIAGNOSIS — Z0000 Encounter for general adult medical examination without abnormal findings: Secondary | ICD-10-CM | POA: Diagnosis not present

## 2021-07-17 DIAGNOSIS — M5430 Sciatica, unspecified side: Secondary | ICD-10-CM | POA: Diagnosis not present

## 2021-07-17 DIAGNOSIS — E039 Hypothyroidism, unspecified: Secondary | ICD-10-CM | POA: Diagnosis not present

## 2021-07-17 DIAGNOSIS — Z23 Encounter for immunization: Secondary | ICD-10-CM | POA: Diagnosis not present

## 2021-07-17 DIAGNOSIS — Z1159 Encounter for screening for other viral diseases: Secondary | ICD-10-CM | POA: Diagnosis not present

## 2021-07-17 DIAGNOSIS — Z1389 Encounter for screening for other disorder: Secondary | ICD-10-CM | POA: Diagnosis not present

## 2021-07-17 DIAGNOSIS — M1990 Unspecified osteoarthritis, unspecified site: Secondary | ICD-10-CM | POA: Diagnosis not present

## 2021-08-15 DIAGNOSIS — E7800 Pure hypercholesterolemia, unspecified: Secondary | ICD-10-CM | POA: Diagnosis not present

## 2021-08-15 DIAGNOSIS — M1990 Unspecified osteoarthritis, unspecified site: Secondary | ICD-10-CM | POA: Diagnosis not present

## 2021-08-15 DIAGNOSIS — E1169 Type 2 diabetes mellitus with other specified complication: Secondary | ICD-10-CM | POA: Diagnosis not present

## 2021-08-15 DIAGNOSIS — E039 Hypothyroidism, unspecified: Secondary | ICD-10-CM | POA: Diagnosis not present

## 2021-08-15 DIAGNOSIS — I10 Essential (primary) hypertension: Secondary | ICD-10-CM | POA: Diagnosis not present

## 2021-08-15 DIAGNOSIS — K219 Gastro-esophageal reflux disease without esophagitis: Secondary | ICD-10-CM | POA: Diagnosis not present

## 2021-09-13 DIAGNOSIS — E039 Hypothyroidism, unspecified: Secondary | ICD-10-CM | POA: Diagnosis not present

## 2021-09-13 DIAGNOSIS — K219 Gastro-esophageal reflux disease without esophagitis: Secondary | ICD-10-CM | POA: Diagnosis not present

## 2021-09-13 DIAGNOSIS — E7800 Pure hypercholesterolemia, unspecified: Secondary | ICD-10-CM | POA: Diagnosis not present

## 2021-09-13 DIAGNOSIS — E1169 Type 2 diabetes mellitus with other specified complication: Secondary | ICD-10-CM | POA: Diagnosis not present

## 2021-09-13 DIAGNOSIS — M1990 Unspecified osteoarthritis, unspecified site: Secondary | ICD-10-CM | POA: Diagnosis not present

## 2021-09-13 DIAGNOSIS — I10 Essential (primary) hypertension: Secondary | ICD-10-CM | POA: Diagnosis not present

## 2021-10-17 DIAGNOSIS — E039 Hypothyroidism, unspecified: Secondary | ICD-10-CM | POA: Diagnosis not present

## 2021-10-17 DIAGNOSIS — E7800 Pure hypercholesterolemia, unspecified: Secondary | ICD-10-CM | POA: Diagnosis not present

## 2021-10-17 DIAGNOSIS — E1169 Type 2 diabetes mellitus with other specified complication: Secondary | ICD-10-CM | POA: Diagnosis not present

## 2021-10-17 DIAGNOSIS — K219 Gastro-esophageal reflux disease without esophagitis: Secondary | ICD-10-CM | POA: Diagnosis not present

## 2021-10-17 DIAGNOSIS — M1990 Unspecified osteoarthritis, unspecified site: Secondary | ICD-10-CM | POA: Diagnosis not present

## 2021-10-17 DIAGNOSIS — I10 Essential (primary) hypertension: Secondary | ICD-10-CM | POA: Diagnosis not present

## 2021-11-14 DIAGNOSIS — K219 Gastro-esophageal reflux disease without esophagitis: Secondary | ICD-10-CM | POA: Diagnosis not present

## 2021-11-14 DIAGNOSIS — E039 Hypothyroidism, unspecified: Secondary | ICD-10-CM | POA: Diagnosis not present

## 2021-11-14 DIAGNOSIS — E7800 Pure hypercholesterolemia, unspecified: Secondary | ICD-10-CM | POA: Diagnosis not present

## 2021-11-14 DIAGNOSIS — I10 Essential (primary) hypertension: Secondary | ICD-10-CM | POA: Diagnosis not present

## 2021-11-14 DIAGNOSIS — E1169 Type 2 diabetes mellitus with other specified complication: Secondary | ICD-10-CM | POA: Diagnosis not present

## 2021-11-14 DIAGNOSIS — M1990 Unspecified osteoarthritis, unspecified site: Secondary | ICD-10-CM | POA: Diagnosis not present

## 2021-12-12 DIAGNOSIS — E1169 Type 2 diabetes mellitus with other specified complication: Secondary | ICD-10-CM | POA: Diagnosis not present

## 2021-12-12 DIAGNOSIS — I10 Essential (primary) hypertension: Secondary | ICD-10-CM | POA: Diagnosis not present

## 2021-12-12 DIAGNOSIS — E7800 Pure hypercholesterolemia, unspecified: Secondary | ICD-10-CM | POA: Diagnosis not present

## 2022-01-11 DIAGNOSIS — E1169 Type 2 diabetes mellitus with other specified complication: Secondary | ICD-10-CM | POA: Diagnosis not present

## 2022-01-11 DIAGNOSIS — I10 Essential (primary) hypertension: Secondary | ICD-10-CM | POA: Diagnosis not present

## 2022-01-11 DIAGNOSIS — E7800 Pure hypercholesterolemia, unspecified: Secondary | ICD-10-CM | POA: Diagnosis not present

## 2022-01-11 DIAGNOSIS — K219 Gastro-esophageal reflux disease without esophagitis: Secondary | ICD-10-CM | POA: Diagnosis not present

## 2022-01-11 DIAGNOSIS — M1990 Unspecified osteoarthritis, unspecified site: Secondary | ICD-10-CM | POA: Diagnosis not present

## 2022-01-15 DIAGNOSIS — E1169 Type 2 diabetes mellitus with other specified complication: Secondary | ICD-10-CM | POA: Diagnosis not present

## 2022-01-15 DIAGNOSIS — K219 Gastro-esophageal reflux disease without esophagitis: Secondary | ICD-10-CM | POA: Diagnosis not present

## 2022-01-15 DIAGNOSIS — I10 Essential (primary) hypertension: Secondary | ICD-10-CM | POA: Diagnosis not present

## 2022-01-15 DIAGNOSIS — E7800 Pure hypercholesterolemia, unspecified: Secondary | ICD-10-CM | POA: Diagnosis not present

## 2022-01-15 DIAGNOSIS — E039 Hypothyroidism, unspecified: Secondary | ICD-10-CM | POA: Diagnosis not present

## 2022-01-15 DIAGNOSIS — Z23 Encounter for immunization: Secondary | ICD-10-CM | POA: Diagnosis not present

## 2022-02-11 DIAGNOSIS — I10 Essential (primary) hypertension: Secondary | ICD-10-CM | POA: Diagnosis not present

## 2022-02-11 DIAGNOSIS — E1169 Type 2 diabetes mellitus with other specified complication: Secondary | ICD-10-CM | POA: Diagnosis not present

## 2022-02-11 DIAGNOSIS — E7800 Pure hypercholesterolemia, unspecified: Secondary | ICD-10-CM | POA: Diagnosis not present

## 2022-03-13 DIAGNOSIS — E1169 Type 2 diabetes mellitus with other specified complication: Secondary | ICD-10-CM | POA: Diagnosis not present

## 2022-03-13 DIAGNOSIS — K219 Gastro-esophageal reflux disease without esophagitis: Secondary | ICD-10-CM | POA: Diagnosis not present

## 2022-03-13 DIAGNOSIS — E039 Hypothyroidism, unspecified: Secondary | ICD-10-CM | POA: Diagnosis not present

## 2022-03-13 DIAGNOSIS — M1990 Unspecified osteoarthritis, unspecified site: Secondary | ICD-10-CM | POA: Diagnosis not present

## 2022-03-13 DIAGNOSIS — I10 Essential (primary) hypertension: Secondary | ICD-10-CM | POA: Diagnosis not present

## 2022-03-13 DIAGNOSIS — E7800 Pure hypercholesterolemia, unspecified: Secondary | ICD-10-CM | POA: Diagnosis not present

## 2022-04-11 DIAGNOSIS — E7800 Pure hypercholesterolemia, unspecified: Secondary | ICD-10-CM | POA: Diagnosis not present

## 2022-04-11 DIAGNOSIS — E1169 Type 2 diabetes mellitus with other specified complication: Secondary | ICD-10-CM | POA: Diagnosis not present

## 2022-04-11 DIAGNOSIS — I10 Essential (primary) hypertension: Secondary | ICD-10-CM | POA: Diagnosis not present

## 2022-05-10 DIAGNOSIS — E039 Hypothyroidism, unspecified: Secondary | ICD-10-CM | POA: Diagnosis not present

## 2022-05-10 DIAGNOSIS — E1169 Type 2 diabetes mellitus with other specified complication: Secondary | ICD-10-CM | POA: Diagnosis not present

## 2022-05-10 DIAGNOSIS — E7800 Pure hypercholesterolemia, unspecified: Secondary | ICD-10-CM | POA: Diagnosis not present

## 2022-05-10 DIAGNOSIS — I10 Essential (primary) hypertension: Secondary | ICD-10-CM | POA: Diagnosis not present

## 2022-05-10 DIAGNOSIS — M1990 Unspecified osteoarthritis, unspecified site: Secondary | ICD-10-CM | POA: Diagnosis not present

## 2022-05-14 DIAGNOSIS — E039 Hypothyroidism, unspecified: Secondary | ICD-10-CM | POA: Diagnosis not present

## 2022-05-14 DIAGNOSIS — E7800 Pure hypercholesterolemia, unspecified: Secondary | ICD-10-CM | POA: Diagnosis not present

## 2022-05-14 DIAGNOSIS — I10 Essential (primary) hypertension: Secondary | ICD-10-CM | POA: Diagnosis not present

## 2022-05-14 DIAGNOSIS — M1990 Unspecified osteoarthritis, unspecified site: Secondary | ICD-10-CM | POA: Diagnosis not present

## 2022-05-14 DIAGNOSIS — E1169 Type 2 diabetes mellitus with other specified complication: Secondary | ICD-10-CM | POA: Diagnosis not present

## 2022-06-10 DIAGNOSIS — I10 Essential (primary) hypertension: Secondary | ICD-10-CM | POA: Diagnosis not present

## 2022-06-10 DIAGNOSIS — E7800 Pure hypercholesterolemia, unspecified: Secondary | ICD-10-CM | POA: Diagnosis not present

## 2022-06-10 DIAGNOSIS — K219 Gastro-esophageal reflux disease without esophagitis: Secondary | ICD-10-CM | POA: Diagnosis not present

## 2022-06-10 DIAGNOSIS — M1990 Unspecified osteoarthritis, unspecified site: Secondary | ICD-10-CM | POA: Diagnosis not present

## 2022-06-10 DIAGNOSIS — E1169 Type 2 diabetes mellitus with other specified complication: Secondary | ICD-10-CM | POA: Diagnosis not present

## 2022-06-17 DIAGNOSIS — E663 Overweight: Secondary | ICD-10-CM | POA: Diagnosis not present

## 2022-06-17 DIAGNOSIS — E1159 Type 2 diabetes mellitus with other circulatory complications: Secondary | ICD-10-CM | POA: Diagnosis not present

## 2022-06-17 DIAGNOSIS — E785 Hyperlipidemia, unspecified: Secondary | ICD-10-CM | POA: Diagnosis not present

## 2022-06-17 DIAGNOSIS — E039 Hypothyroidism, unspecified: Secondary | ICD-10-CM | POA: Diagnosis not present

## 2022-06-17 DIAGNOSIS — I10 Essential (primary) hypertension: Secondary | ICD-10-CM | POA: Diagnosis not present

## 2022-06-17 DIAGNOSIS — E1169 Type 2 diabetes mellitus with other specified complication: Secondary | ICD-10-CM | POA: Diagnosis not present

## 2022-06-17 DIAGNOSIS — K219 Gastro-esophageal reflux disease without esophagitis: Secondary | ICD-10-CM | POA: Diagnosis not present

## 2022-07-10 DIAGNOSIS — E039 Hypothyroidism, unspecified: Secondary | ICD-10-CM | POA: Diagnosis not present

## 2022-07-10 DIAGNOSIS — E7800 Pure hypercholesterolemia, unspecified: Secondary | ICD-10-CM | POA: Diagnosis not present

## 2022-07-10 DIAGNOSIS — K219 Gastro-esophageal reflux disease without esophagitis: Secondary | ICD-10-CM | POA: Diagnosis not present

## 2022-07-10 DIAGNOSIS — M1990 Unspecified osteoarthritis, unspecified site: Secondary | ICD-10-CM | POA: Diagnosis not present

## 2022-07-10 DIAGNOSIS — E1169 Type 2 diabetes mellitus with other specified complication: Secondary | ICD-10-CM | POA: Diagnosis not present

## 2022-07-10 DIAGNOSIS — I10 Essential (primary) hypertension: Secondary | ICD-10-CM | POA: Diagnosis not present

## 2022-07-10 DIAGNOSIS — D508 Other iron deficiency anemias: Secondary | ICD-10-CM | POA: Diagnosis not present

## 2022-07-18 DIAGNOSIS — Z1211 Encounter for screening for malignant neoplasm of colon: Secondary | ICD-10-CM | POA: Diagnosis not present

## 2022-07-18 DIAGNOSIS — M1990 Unspecified osteoarthritis, unspecified site: Secondary | ICD-10-CM | POA: Diagnosis not present

## 2022-07-18 DIAGNOSIS — Z0000 Encounter for general adult medical examination without abnormal findings: Secondary | ICD-10-CM | POA: Diagnosis not present

## 2022-07-18 DIAGNOSIS — E039 Hypothyroidism, unspecified: Secondary | ICD-10-CM | POA: Diagnosis not present

## 2022-07-18 DIAGNOSIS — E7800 Pure hypercholesterolemia, unspecified: Secondary | ICD-10-CM | POA: Diagnosis not present

## 2022-07-18 DIAGNOSIS — Z1331 Encounter for screening for depression: Secondary | ICD-10-CM | POA: Diagnosis not present

## 2022-07-18 DIAGNOSIS — E1169 Type 2 diabetes mellitus with other specified complication: Secondary | ICD-10-CM | POA: Diagnosis not present

## 2022-07-18 DIAGNOSIS — K219 Gastro-esophageal reflux disease without esophagitis: Secondary | ICD-10-CM | POA: Diagnosis not present

## 2022-07-18 DIAGNOSIS — I10 Essential (primary) hypertension: Secondary | ICD-10-CM | POA: Diagnosis not present

## 2022-07-25 DIAGNOSIS — Z1211 Encounter for screening for malignant neoplasm of colon: Secondary | ICD-10-CM | POA: Diagnosis not present

## 2022-08-08 DIAGNOSIS — I10 Essential (primary) hypertension: Secondary | ICD-10-CM | POA: Diagnosis not present

## 2022-08-08 DIAGNOSIS — E7800 Pure hypercholesterolemia, unspecified: Secondary | ICD-10-CM | POA: Diagnosis not present

## 2022-08-08 DIAGNOSIS — E039 Hypothyroidism, unspecified: Secondary | ICD-10-CM | POA: Diagnosis not present

## 2022-08-08 DIAGNOSIS — K219 Gastro-esophageal reflux disease without esophagitis: Secondary | ICD-10-CM | POA: Diagnosis not present

## 2022-08-08 DIAGNOSIS — E1169 Type 2 diabetes mellitus with other specified complication: Secondary | ICD-10-CM | POA: Diagnosis not present

## 2022-08-08 DIAGNOSIS — M1990 Unspecified osteoarthritis, unspecified site: Secondary | ICD-10-CM | POA: Diagnosis not present

## 2022-08-30 DIAGNOSIS — D508 Other iron deficiency anemias: Secondary | ICD-10-CM | POA: Diagnosis not present

## 2022-09-17 DIAGNOSIS — E1169 Type 2 diabetes mellitus with other specified complication: Secondary | ICD-10-CM | POA: Diagnosis not present

## 2022-09-17 DIAGNOSIS — K219 Gastro-esophageal reflux disease without esophagitis: Secondary | ICD-10-CM | POA: Diagnosis not present

## 2022-09-17 DIAGNOSIS — I10 Essential (primary) hypertension: Secondary | ICD-10-CM | POA: Diagnosis not present

## 2022-09-17 DIAGNOSIS — E7800 Pure hypercholesterolemia, unspecified: Secondary | ICD-10-CM | POA: Diagnosis not present

## 2022-09-17 DIAGNOSIS — E039 Hypothyroidism, unspecified: Secondary | ICD-10-CM | POA: Diagnosis not present

## 2022-09-17 DIAGNOSIS — M1990 Unspecified osteoarthritis, unspecified site: Secondary | ICD-10-CM | POA: Diagnosis not present

## 2022-10-17 DIAGNOSIS — M1990 Unspecified osteoarthritis, unspecified site: Secondary | ICD-10-CM | POA: Diagnosis not present

## 2022-10-17 DIAGNOSIS — E039 Hypothyroidism, unspecified: Secondary | ICD-10-CM | POA: Diagnosis not present

## 2022-10-17 DIAGNOSIS — E1169 Type 2 diabetes mellitus with other specified complication: Secondary | ICD-10-CM | POA: Diagnosis not present

## 2022-10-17 DIAGNOSIS — K219 Gastro-esophageal reflux disease without esophagitis: Secondary | ICD-10-CM | POA: Diagnosis not present

## 2022-10-17 DIAGNOSIS — E7800 Pure hypercholesterolemia, unspecified: Secondary | ICD-10-CM | POA: Diagnosis not present

## 2022-10-17 DIAGNOSIS — I10 Essential (primary) hypertension: Secondary | ICD-10-CM | POA: Diagnosis not present

## 2022-11-05 DIAGNOSIS — E7800 Pure hypercholesterolemia, unspecified: Secondary | ICD-10-CM | POA: Diagnosis not present

## 2022-11-05 DIAGNOSIS — E1169 Type 2 diabetes mellitus with other specified complication: Secondary | ICD-10-CM | POA: Diagnosis not present

## 2022-11-05 DIAGNOSIS — M1990 Unspecified osteoarthritis, unspecified site: Secondary | ICD-10-CM | POA: Diagnosis not present

## 2022-11-05 DIAGNOSIS — I10 Essential (primary) hypertension: Secondary | ICD-10-CM | POA: Diagnosis not present

## 2022-11-05 DIAGNOSIS — K219 Gastro-esophageal reflux disease without esophagitis: Secondary | ICD-10-CM | POA: Diagnosis not present

## 2022-11-05 DIAGNOSIS — E039 Hypothyroidism, unspecified: Secondary | ICD-10-CM | POA: Diagnosis not present

## 2022-12-16 DIAGNOSIS — I10 Essential (primary) hypertension: Secondary | ICD-10-CM | POA: Diagnosis not present

## 2022-12-16 DIAGNOSIS — E7800 Pure hypercholesterolemia, unspecified: Secondary | ICD-10-CM | POA: Diagnosis not present

## 2022-12-16 DIAGNOSIS — E039 Hypothyroidism, unspecified: Secondary | ICD-10-CM | POA: Diagnosis not present

## 2022-12-16 DIAGNOSIS — M1990 Unspecified osteoarthritis, unspecified site: Secondary | ICD-10-CM | POA: Diagnosis not present

## 2022-12-16 DIAGNOSIS — E1169 Type 2 diabetes mellitus with other specified complication: Secondary | ICD-10-CM | POA: Diagnosis not present

## 2022-12-16 DIAGNOSIS — K219 Gastro-esophageal reflux disease without esophagitis: Secondary | ICD-10-CM | POA: Diagnosis not present

## 2022-12-25 DIAGNOSIS — K219 Gastro-esophageal reflux disease without esophagitis: Secondary | ICD-10-CM | POA: Diagnosis not present

## 2022-12-25 DIAGNOSIS — R7303 Prediabetes: Secondary | ICD-10-CM | POA: Diagnosis not present

## 2022-12-25 DIAGNOSIS — E039 Hypothyroidism, unspecified: Secondary | ICD-10-CM | POA: Diagnosis not present

## 2022-12-25 DIAGNOSIS — M1990 Unspecified osteoarthritis, unspecified site: Secondary | ICD-10-CM | POA: Diagnosis not present

## 2022-12-25 DIAGNOSIS — E785 Hyperlipidemia, unspecified: Secondary | ICD-10-CM | POA: Diagnosis not present

## 2022-12-25 DIAGNOSIS — I10 Essential (primary) hypertension: Secondary | ICD-10-CM | POA: Diagnosis not present

## 2022-12-25 DIAGNOSIS — G8929 Other chronic pain: Secondary | ICD-10-CM | POA: Diagnosis not present

## 2022-12-25 DIAGNOSIS — E663 Overweight: Secondary | ICD-10-CM | POA: Diagnosis not present

## 2022-12-25 DIAGNOSIS — E559 Vitamin D deficiency, unspecified: Secondary | ICD-10-CM | POA: Diagnosis not present

## 2023-01-06 DIAGNOSIS — M1990 Unspecified osteoarthritis, unspecified site: Secondary | ICD-10-CM | POA: Diagnosis not present

## 2023-01-06 DIAGNOSIS — E039 Hypothyroidism, unspecified: Secondary | ICD-10-CM | POA: Diagnosis not present

## 2023-01-06 DIAGNOSIS — K219 Gastro-esophageal reflux disease without esophagitis: Secondary | ICD-10-CM | POA: Diagnosis not present

## 2023-01-06 DIAGNOSIS — I10 Essential (primary) hypertension: Secondary | ICD-10-CM | POA: Diagnosis not present

## 2023-01-06 DIAGNOSIS — E7800 Pure hypercholesterolemia, unspecified: Secondary | ICD-10-CM | POA: Diagnosis not present

## 2023-01-06 DIAGNOSIS — E1169 Type 2 diabetes mellitus with other specified complication: Secondary | ICD-10-CM | POA: Diagnosis not present

## 2023-01-16 DIAGNOSIS — E039 Hypothyroidism, unspecified: Secondary | ICD-10-CM | POA: Diagnosis not present

## 2023-01-16 DIAGNOSIS — R059 Cough, unspecified: Secondary | ICD-10-CM | POA: Diagnosis not present

## 2023-01-16 DIAGNOSIS — E7800 Pure hypercholesterolemia, unspecified: Secondary | ICD-10-CM | POA: Diagnosis not present

## 2023-01-16 DIAGNOSIS — E119 Type 2 diabetes mellitus without complications: Secondary | ICD-10-CM | POA: Diagnosis not present

## 2023-01-16 DIAGNOSIS — I10 Essential (primary) hypertension: Secondary | ICD-10-CM | POA: Diagnosis not present

## 2023-01-16 DIAGNOSIS — E1169 Type 2 diabetes mellitus with other specified complication: Secondary | ICD-10-CM | POA: Diagnosis not present

## 2023-07-24 DIAGNOSIS — K219 Gastro-esophageal reflux disease without esophagitis: Secondary | ICD-10-CM | POA: Diagnosis not present

## 2023-07-24 DIAGNOSIS — E1122 Type 2 diabetes mellitus with diabetic chronic kidney disease: Secondary | ICD-10-CM | POA: Diagnosis not present

## 2023-07-24 DIAGNOSIS — H8110 Benign paroxysmal vertigo, unspecified ear: Secondary | ICD-10-CM | POA: Diagnosis not present

## 2023-07-24 DIAGNOSIS — E7800 Pure hypercholesterolemia, unspecified: Secondary | ICD-10-CM | POA: Diagnosis not present

## 2023-07-24 DIAGNOSIS — Z1331 Encounter for screening for depression: Secondary | ICD-10-CM | POA: Diagnosis not present

## 2023-07-24 DIAGNOSIS — E039 Hypothyroidism, unspecified: Secondary | ICD-10-CM | POA: Diagnosis not present

## 2023-07-24 DIAGNOSIS — E1121 Type 2 diabetes mellitus with diabetic nephropathy: Secondary | ICD-10-CM | POA: Diagnosis not present

## 2023-07-24 DIAGNOSIS — J309 Allergic rhinitis, unspecified: Secondary | ICD-10-CM | POA: Diagnosis not present

## 2023-07-24 DIAGNOSIS — E119 Type 2 diabetes mellitus without complications: Secondary | ICD-10-CM | POA: Diagnosis not present

## 2023-07-24 DIAGNOSIS — I10 Essential (primary) hypertension: Secondary | ICD-10-CM | POA: Diagnosis not present

## 2023-07-24 DIAGNOSIS — Z0000 Encounter for general adult medical examination without abnormal findings: Secondary | ICD-10-CM | POA: Diagnosis not present

## 2023-07-24 DIAGNOSIS — D508 Other iron deficiency anemias: Secondary | ICD-10-CM | POA: Diagnosis not present

## 2023-07-24 DIAGNOSIS — N182 Chronic kidney disease, stage 2 (mild): Secondary | ICD-10-CM | POA: Diagnosis not present

## 2024-01-26 DIAGNOSIS — E039 Hypothyroidism, unspecified: Secondary | ICD-10-CM | POA: Diagnosis not present

## 2024-01-26 DIAGNOSIS — K219 Gastro-esophageal reflux disease without esophagitis: Secondary | ICD-10-CM | POA: Diagnosis not present

## 2024-01-26 DIAGNOSIS — H8110 Benign paroxysmal vertigo, unspecified ear: Secondary | ICD-10-CM | POA: Diagnosis not present

## 2024-01-26 DIAGNOSIS — E1169 Type 2 diabetes mellitus with other specified complication: Secondary | ICD-10-CM | POA: Diagnosis not present

## 2024-01-26 DIAGNOSIS — E7800 Pure hypercholesterolemia, unspecified: Secondary | ICD-10-CM | POA: Diagnosis not present

## 2024-01-26 DIAGNOSIS — I10 Essential (primary) hypertension: Secondary | ICD-10-CM | POA: Diagnosis not present

## 2024-01-26 DIAGNOSIS — M1990 Unspecified osteoarthritis, unspecified site: Secondary | ICD-10-CM | POA: Diagnosis not present

## 2024-06-17 DIAGNOSIS — E039 Hypothyroidism, unspecified: Secondary | ICD-10-CM | POA: Diagnosis not present

## 2024-06-17 DIAGNOSIS — M1990 Unspecified osteoarthritis, unspecified site: Secondary | ICD-10-CM | POA: Diagnosis not present

## 2024-06-17 DIAGNOSIS — E1121 Type 2 diabetes mellitus with diabetic nephropathy: Secondary | ICD-10-CM | POA: Diagnosis not present

## 2024-06-17 DIAGNOSIS — E1169 Type 2 diabetes mellitus with other specified complication: Secondary | ICD-10-CM | POA: Diagnosis not present

## 2024-07-18 DIAGNOSIS — E039 Hypothyroidism, unspecified: Secondary | ICD-10-CM | POA: Diagnosis not present

## 2024-07-18 DIAGNOSIS — E1169 Type 2 diabetes mellitus with other specified complication: Secondary | ICD-10-CM | POA: Diagnosis not present

## 2024-07-18 DIAGNOSIS — M1990 Unspecified osteoarthritis, unspecified site: Secondary | ICD-10-CM | POA: Diagnosis not present

## 2024-07-18 DIAGNOSIS — E1121 Type 2 diabetes mellitus with diabetic nephropathy: Secondary | ICD-10-CM | POA: Diagnosis not present

## 2024-08-11 DIAGNOSIS — E663 Overweight: Secondary | ICD-10-CM | POA: Diagnosis not present

## 2024-08-11 DIAGNOSIS — K219 Gastro-esophageal reflux disease without esophagitis: Secondary | ICD-10-CM | POA: Diagnosis not present

## 2024-08-11 DIAGNOSIS — M1990 Unspecified osteoarthritis, unspecified site: Secondary | ICD-10-CM | POA: Diagnosis not present

## 2024-08-11 DIAGNOSIS — E1169 Type 2 diabetes mellitus with other specified complication: Secondary | ICD-10-CM | POA: Diagnosis not present

## 2024-08-11 DIAGNOSIS — E1121 Type 2 diabetes mellitus with diabetic nephropathy: Secondary | ICD-10-CM | POA: Diagnosis not present

## 2024-08-11 DIAGNOSIS — G8929 Other chronic pain: Secondary | ICD-10-CM | POA: Diagnosis not present

## 2024-08-11 DIAGNOSIS — E559 Vitamin D deficiency, unspecified: Secondary | ICD-10-CM | POA: Diagnosis not present

## 2024-08-11 DIAGNOSIS — E039 Hypothyroidism, unspecified: Secondary | ICD-10-CM | POA: Diagnosis not present

## 2024-08-11 DIAGNOSIS — I10 Essential (primary) hypertension: Secondary | ICD-10-CM | POA: Diagnosis not present

## 2024-08-11 DIAGNOSIS — E785 Hyperlipidemia, unspecified: Secondary | ICD-10-CM | POA: Diagnosis not present

## 2024-08-19 ENCOUNTER — Other Ambulatory Visit (HOSPITAL_BASED_OUTPATIENT_CLINIC_OR_DEPARTMENT_OTHER): Payer: Self-pay | Admitting: Internal Medicine

## 2024-08-19 DIAGNOSIS — Z23 Encounter for immunization: Secondary | ICD-10-CM | POA: Diagnosis not present

## 2024-08-19 DIAGNOSIS — E039 Hypothyroidism, unspecified: Secondary | ICD-10-CM | POA: Diagnosis not present

## 2024-08-19 DIAGNOSIS — N182 Chronic kidney disease, stage 2 (mild): Secondary | ICD-10-CM | POA: Diagnosis not present

## 2024-08-19 DIAGNOSIS — E1121 Type 2 diabetes mellitus with diabetic nephropathy: Secondary | ICD-10-CM | POA: Diagnosis not present

## 2024-08-19 DIAGNOSIS — R109 Unspecified abdominal pain: Secondary | ICD-10-CM

## 2024-08-19 DIAGNOSIS — Z0000 Encounter for general adult medical examination without abnormal findings: Secondary | ICD-10-CM | POA: Diagnosis not present

## 2024-08-19 DIAGNOSIS — I10 Essential (primary) hypertension: Secondary | ICD-10-CM | POA: Diagnosis not present

## 2024-08-19 DIAGNOSIS — M1990 Unspecified osteoarthritis, unspecified site: Secondary | ICD-10-CM | POA: Diagnosis not present

## 2024-08-19 DIAGNOSIS — Z1331 Encounter for screening for depression: Secondary | ICD-10-CM | POA: Diagnosis not present

## 2024-08-19 DIAGNOSIS — E7800 Pure hypercholesterolemia, unspecified: Secondary | ICD-10-CM | POA: Diagnosis not present
